# Patient Record
Sex: Male | Born: 1970
Health system: Southern US, Community
[De-identification: ages and names within clinical notes are randomized; demographics above are authoritative.]

## PROBLEM LIST (undated history)

## (undated) DIAGNOSIS — T7840XA Allergy, unspecified, initial encounter: Secondary | ICD-10-CM

## (undated) DIAGNOSIS — K760 Fatty (change of) liver, not elsewhere classified: Secondary | ICD-10-CM

## (undated) HISTORY — PX: CHOLECYSTECTOMY: SHX55

## (undated) HISTORY — DX: Allergy, unspecified, initial encounter: T78.40XA

## (undated) HISTORY — DX: Fatty (change of) liver, not elsewhere classified: K76.0

---

## 1999-04-01 ENCOUNTER — Encounter: Payer: Self-pay | Admitting: General Surgery

## 1999-04-03 ENCOUNTER — Observation Stay (HOSPITAL_COMMUNITY): Admission: RE | Admit: 1999-04-03 | Discharge: 1999-04-04 | Payer: Self-pay | Admitting: General Surgery

## 1999-04-03 ENCOUNTER — Encounter: Payer: Self-pay | Admitting: General Surgery

## 2013-07-19 ENCOUNTER — Ambulatory Visit: Payer: BC Managed Care – PPO

## 2013-07-19 ENCOUNTER — Other Ambulatory Visit: Payer: Self-pay | Admitting: Emergency Medicine

## 2013-07-19 ENCOUNTER — Ambulatory Visit (INDEPENDENT_AMBULATORY_CARE_PROVIDER_SITE_OTHER): Payer: BC Managed Care – PPO | Admitting: Emergency Medicine

## 2013-07-19 VITALS — BP 122/88 | HR 89 | Temp 97.3°F | Resp 16 | Ht 69.75 in | Wt 282.6 lb

## 2013-07-19 DIAGNOSIS — M109 Gout, unspecified: Secondary | ICD-10-CM

## 2013-07-19 DIAGNOSIS — M79641 Pain in right hand: Secondary | ICD-10-CM

## 2013-07-19 DIAGNOSIS — M79609 Pain in unspecified limb: Secondary | ICD-10-CM

## 2013-07-19 MED ORDER — COLCHICINE 0.6 MG PO TABS: ORAL_TABLET | ORAL | Status: AC

## 2013-07-19 MED ORDER — INDOMETHACIN 50 MG PO CAPS: 50.0000 mg | ORAL_CAPSULE | Freq: Two times a day (BID) | ORAL | Status: AC

## 2013-07-19 NOTE — Patient Instructions (Signed)

## 2013-07-19 NOTE — Progress Notes (Addendum)
[facility] [address], [place] [address] [phone]  Date:  07/19/2013   Name:  Alexander Taylor   DOB:  03/12/1971   MRN:  [id number]  PCP:  No primary provider on file.    Chief Complaint: Wrist Pain   History of Present Illness:  Alexander Rouxoel Steger is a 43 y.o. very pleasant male patient who presents with the following:  No history of injury.  Says sudden onset swelling, redness and pain and tenderness right wrist last night.  No overuse.  No history of gout or inflammatory joint disease.  No history of antecedent infection.  No improvement with over the counter medications or other home remedies. Denies other complaint or health concern today.   There are no active problems to display for this patient.   Past Medical History  Diagnosis Date  . Allergy   . Fatty liver     Past Surgical History  Procedure Laterality Date  . Cholecystectomy      History  Substance Use Topics  . Smoking status: Never Smoker   . Smokeless tobacco: Not on file  . Alcohol Use: Yes    Family History  Problem Relation Age of Onset  . Hyperlipidemia Mother   . Hypertension Mother   . Cancer Sister   . Hypertension Brother   . Hyperlipidemia Brother   . Stroke Brother   . Cancer Maternal Grandmother     Allergies  Allergen Reactions  . Penicillins Rash    Medication list has been reviewed and updated.  No current outpatient prescriptions on file prior to visit.   No current facility-administered medications on file prior to visit.    Review of Systems:  As per HPI, otherwise negative.    Physical Examination: Filed Vitals:   07/19/13 1633  BP: 122/88  Pulse: 89  Temp: 97.3 F (36.3 C)  Resp: 16   Filed Vitals:   07/19/13 1633  Height: 5' 9.75" (1.772 m)  Weight: 282 lb 9.6 oz (128.187 kg)   Body mass index is 40.82 kg/(m^2). Ideal Body Weight: Weight in (lb) to have BMI = 25: 172.6   GEN: WDWN, NAD, Non-toxic, Alert & Oriented x 3 HEENT:  Atraumatic, Normocephalic.  Ears and Nose: No external deformity. EXTR: No clubbing/cyanosis/edema NEURO: Normal gait.  PSYCH: Normally interactive. Conversant. Not depressed or anxious appearing.  Calm demeanor.  RIGHT wrist:  Tender, erythematous, mild effusion.  Guards flexion and extension.  NATI  Assessment and Plan: Gout  Signed,  Phillips OdorJeffery Anderson, MD   [facility] reading (PRIMARY) by  Dr. Dareen PianoAnderson.  negative.

## 2013-07-20 LAB — URIC ACID: Uric Acid, Serum: 7.2 mg/dL (ref 4.0–7.8)

## 2015-09-10 DIAGNOSIS — M62838 Other muscle spasm: Secondary | ICD-10-CM | POA: Diagnosis not present

## 2015-09-10 DIAGNOSIS — M9902 Segmental and somatic dysfunction of thoracic region: Secondary | ICD-10-CM | POA: Diagnosis not present

## 2015-09-10 DIAGNOSIS — M9901 Segmental and somatic dysfunction of cervical region: Secondary | ICD-10-CM | POA: Diagnosis not present

## 2015-09-10 DIAGNOSIS — M25512 Pain in left shoulder: Secondary | ICD-10-CM | POA: Diagnosis not present

## 2015-09-13 DIAGNOSIS — M9901 Segmental and somatic dysfunction of cervical region: Secondary | ICD-10-CM | POA: Diagnosis not present

## 2015-09-13 DIAGNOSIS — M62838 Other muscle spasm: Secondary | ICD-10-CM | POA: Diagnosis not present

## 2015-09-13 DIAGNOSIS — M9902 Segmental and somatic dysfunction of thoracic region: Secondary | ICD-10-CM | POA: Diagnosis not present

## 2015-09-13 DIAGNOSIS — M25512 Pain in left shoulder: Secondary | ICD-10-CM | POA: Diagnosis not present

## 2015-09-18 DIAGNOSIS — M9901 Segmental and somatic dysfunction of cervical region: Secondary | ICD-10-CM | POA: Diagnosis not present

## 2015-09-18 DIAGNOSIS — M25512 Pain in left shoulder: Secondary | ICD-10-CM | POA: Diagnosis not present

## 2015-09-18 DIAGNOSIS — M62838 Other muscle spasm: Secondary | ICD-10-CM | POA: Diagnosis not present

## 2015-09-18 DIAGNOSIS — M9902 Segmental and somatic dysfunction of thoracic region: Secondary | ICD-10-CM | POA: Diagnosis not present

## 2015-09-21 DIAGNOSIS — M9902 Segmental and somatic dysfunction of thoracic region: Secondary | ICD-10-CM | POA: Diagnosis not present

## 2015-09-21 DIAGNOSIS — M62838 Other muscle spasm: Secondary | ICD-10-CM | POA: Diagnosis not present

## 2015-09-21 DIAGNOSIS — M25512 Pain in left shoulder: Secondary | ICD-10-CM | POA: Diagnosis not present

## 2015-09-21 DIAGNOSIS — M9901 Segmental and somatic dysfunction of cervical region: Secondary | ICD-10-CM | POA: Diagnosis not present

## 2015-10-16 DIAGNOSIS — H5213 Myopia, bilateral: Secondary | ICD-10-CM | POA: Diagnosis not present

## 2015-10-17 DIAGNOSIS — J019 Acute sinusitis, unspecified: Secondary | ICD-10-CM | POA: Diagnosis not present

## 2016-01-31 DIAGNOSIS — K7581 Nonalcoholic steatohepatitis (NASH): Secondary | ICD-10-CM | POA: Diagnosis not present

## 2016-01-31 DIAGNOSIS — J4520 Mild intermittent asthma, uncomplicated: Secondary | ICD-10-CM | POA: Diagnosis not present

## 2016-01-31 DIAGNOSIS — E781 Pure hyperglyceridemia: Secondary | ICD-10-CM | POA: Diagnosis not present

## 2016-01-31 DIAGNOSIS — M109 Gout, unspecified: Secondary | ICD-10-CM | POA: Diagnosis not present

## 2016-01-31 DIAGNOSIS — Z0001 Encounter for general adult medical examination with abnormal findings: Secondary | ICD-10-CM | POA: Diagnosis not present

## 2016-03-03 DIAGNOSIS — R05 Cough: Secondary | ICD-10-CM | POA: Diagnosis not present

## 2016-03-03 DIAGNOSIS — B349 Viral infection, unspecified: Secondary | ICD-10-CM | POA: Diagnosis not present

## 2016-03-18 DIAGNOSIS — J209 Acute bronchitis, unspecified: Secondary | ICD-10-CM | POA: Diagnosis not present

## 2016-03-18 DIAGNOSIS — R062 Wheezing: Secondary | ICD-10-CM | POA: Diagnosis not present

## 2016-03-18 DIAGNOSIS — R05 Cough: Secondary | ICD-10-CM | POA: Diagnosis not present

## 2016-04-04 DIAGNOSIS — M9903 Segmental and somatic dysfunction of lumbar region: Secondary | ICD-10-CM | POA: Diagnosis not present

## 2016-04-04 DIAGNOSIS — M545 Low back pain: Secondary | ICD-10-CM | POA: Diagnosis not present

## 2016-04-04 DIAGNOSIS — M9904 Segmental and somatic dysfunction of sacral region: Secondary | ICD-10-CM | POA: Diagnosis not present

## 2016-04-04 DIAGNOSIS — M546 Pain in thoracic spine: Secondary | ICD-10-CM | POA: Diagnosis not present

## 2016-04-07 DIAGNOSIS — M9904 Segmental and somatic dysfunction of sacral region: Secondary | ICD-10-CM | POA: Diagnosis not present

## 2016-04-07 DIAGNOSIS — M545 Low back pain: Secondary | ICD-10-CM | POA: Diagnosis not present

## 2016-04-07 DIAGNOSIS — M9903 Segmental and somatic dysfunction of lumbar region: Secondary | ICD-10-CM | POA: Diagnosis not present

## 2016-04-07 DIAGNOSIS — M546 Pain in thoracic spine: Secondary | ICD-10-CM | POA: Diagnosis not present

## 2016-07-25 MED FILL — OSELTAMIVIR PHOSPHATE 75 MG: 75 | 10 days supply | Qty: 10 | Fill #0

## 2016-11-26 DIAGNOSIS — H524 Presbyopia: Secondary | ICD-10-CM | POA: Diagnosis not present

## 2017-02-05 DIAGNOSIS — Z1211 Encounter for screening for malignant neoplasm of colon: Secondary | ICD-10-CM | POA: Diagnosis not present

## 2017-02-05 DIAGNOSIS — K429 Umbilical hernia without obstruction or gangrene: Secondary | ICD-10-CM | POA: Diagnosis not present

## 2017-02-05 DIAGNOSIS — K760 Fatty (change of) liver, not elsewhere classified: Secondary | ICD-10-CM | POA: Diagnosis not present

## 2017-02-05 DIAGNOSIS — K7581 Nonalcoholic steatohepatitis (NASH): Secondary | ICD-10-CM | POA: Diagnosis not present

## 2017-03-04 DIAGNOSIS — Z0000 Encounter for general adult medical examination without abnormal findings: Secondary | ICD-10-CM | POA: Diagnosis not present

## 2017-03-04 DIAGNOSIS — Z13220 Encounter for screening for lipoid disorders: Secondary | ICD-10-CM | POA: Diagnosis not present

## 2017-03-04 DIAGNOSIS — Z23 Encounter for immunization: Secondary | ICD-10-CM | POA: Diagnosis not present

## 2017-04-14 DIAGNOSIS — K5730 Diverticulosis of large intestine without perforation or abscess without bleeding: Secondary | ICD-10-CM | POA: Diagnosis not present

## 2017-04-14 DIAGNOSIS — K640 First degree hemorrhoids: Secondary | ICD-10-CM | POA: Diagnosis not present

## 2017-04-14 DIAGNOSIS — Z1211 Encounter for screening for malignant neoplasm of colon: Secondary | ICD-10-CM | POA: Diagnosis not present

## 2017-05-15 DIAGNOSIS — M545 Low back pain: Secondary | ICD-10-CM | POA: Diagnosis not present

## 2017-05-15 DIAGNOSIS — M9903 Segmental and somatic dysfunction of lumbar region: Secondary | ICD-10-CM | POA: Diagnosis not present

## 2017-05-15 DIAGNOSIS — M5406 Panniculitis affecting regions of neck and back, lumbar region: Secondary | ICD-10-CM | POA: Diagnosis not present

## 2017-05-15 DIAGNOSIS — M9902 Segmental and somatic dysfunction of thoracic region: Secondary | ICD-10-CM | POA: Diagnosis not present

## 2017-11-26 DIAGNOSIS — M9901 Segmental and somatic dysfunction of cervical region: Secondary | ICD-10-CM | POA: Diagnosis not present

## 2017-11-26 DIAGNOSIS — M9902 Segmental and somatic dysfunction of thoracic region: Secondary | ICD-10-CM | POA: Diagnosis not present

## 2017-11-26 DIAGNOSIS — M546 Pain in thoracic spine: Secondary | ICD-10-CM | POA: Diagnosis not present

## 2017-11-26 DIAGNOSIS — M62838 Other muscle spasm: Secondary | ICD-10-CM | POA: Diagnosis not present

## 2017-12-01 DIAGNOSIS — J069 Acute upper respiratory infection, unspecified: Secondary | ICD-10-CM | POA: Diagnosis not present

## 2017-12-14 DIAGNOSIS — H11433 Conjunctival hyperemia, bilateral: Secondary | ICD-10-CM | POA: Diagnosis not present

## 2017-12-18 DIAGNOSIS — R05 Cough: Secondary | ICD-10-CM | POA: Diagnosis not present

## 2017-12-18 DIAGNOSIS — H5213 Myopia, bilateral: Secondary | ICD-10-CM | POA: Diagnosis not present

## 2017-12-18 DIAGNOSIS — J329 Chronic sinusitis, unspecified: Secondary | ICD-10-CM | POA: Diagnosis not present

## 2018-02-05 DIAGNOSIS — K760 Fatty (change of) liver, not elsewhere classified: Secondary | ICD-10-CM | POA: Diagnosis not present

## 2018-02-05 DIAGNOSIS — K7581 Nonalcoholic steatohepatitis (NASH): Secondary | ICD-10-CM | POA: Diagnosis not present

## 2018-04-12 DIAGNOSIS — Z0000 Encounter for general adult medical examination without abnormal findings: Secondary | ICD-10-CM | POA: Diagnosis not present

## 2018-04-15 DIAGNOSIS — J45998 Other asthma: Secondary | ICD-10-CM | POA: Diagnosis not present

## 2018-04-15 DIAGNOSIS — R7309 Other abnormal glucose: Secondary | ICD-10-CM | POA: Diagnosis not present

## 2018-04-15 DIAGNOSIS — M109 Gout, unspecified: Secondary | ICD-10-CM | POA: Diagnosis not present

## 2018-04-15 DIAGNOSIS — Z0000 Encounter for general adult medical examination without abnormal findings: Secondary | ICD-10-CM | POA: Diagnosis not present

## 2018-08-03 DIAGNOSIS — M542 Cervicalgia: Secondary | ICD-10-CM | POA: Diagnosis not present

## 2018-08-03 DIAGNOSIS — M9901 Segmental and somatic dysfunction of cervical region: Secondary | ICD-10-CM | POA: Diagnosis not present

## 2018-08-03 DIAGNOSIS — M546 Pain in thoracic spine: Secondary | ICD-10-CM | POA: Diagnosis not present

## 2018-08-03 DIAGNOSIS — M9902 Segmental and somatic dysfunction of thoracic region: Secondary | ICD-10-CM | POA: Diagnosis not present

## 2018-08-05 DIAGNOSIS — M542 Cervicalgia: Secondary | ICD-10-CM | POA: Diagnosis not present

## 2018-08-05 DIAGNOSIS — M9901 Segmental and somatic dysfunction of cervical region: Secondary | ICD-10-CM | POA: Diagnosis not present

## 2018-08-05 DIAGNOSIS — M546 Pain in thoracic spine: Secondary | ICD-10-CM | POA: Diagnosis not present

## 2018-08-05 DIAGNOSIS — M9902 Segmental and somatic dysfunction of thoracic region: Secondary | ICD-10-CM | POA: Diagnosis not present

## 2018-08-09 DIAGNOSIS — M546 Pain in thoracic spine: Secondary | ICD-10-CM | POA: Diagnosis not present

## 2018-08-09 DIAGNOSIS — M9901 Segmental and somatic dysfunction of cervical region: Secondary | ICD-10-CM | POA: Diagnosis not present

## 2018-08-09 DIAGNOSIS — M9902 Segmental and somatic dysfunction of thoracic region: Secondary | ICD-10-CM | POA: Diagnosis not present

## 2018-08-09 DIAGNOSIS — M542 Cervicalgia: Secondary | ICD-10-CM | POA: Diagnosis not present

## 2019-01-24 DIAGNOSIS — S1086XA Insect bite of other specified part of neck, initial encounter: Secondary | ICD-10-CM | POA: Diagnosis not present

## 2019-01-24 DIAGNOSIS — L259 Unspecified contact dermatitis, unspecified cause: Secondary | ICD-10-CM | POA: Diagnosis not present

## 2019-02-08 DIAGNOSIS — H5213 Myopia, bilateral: Secondary | ICD-10-CM | POA: Diagnosis not present

## 2019-05-19 DIAGNOSIS — Z13220 Encounter for screening for lipoid disorders: Secondary | ICD-10-CM | POA: Diagnosis not present

## 2019-05-19 DIAGNOSIS — K7581 Nonalcoholic steatohepatitis (NASH): Secondary | ICD-10-CM | POA: Diagnosis not present

## 2019-05-19 DIAGNOSIS — R7309 Other abnormal glucose: Secondary | ICD-10-CM | POA: Diagnosis not present

## 2019-05-19 DIAGNOSIS — R0789 Other chest pain: Secondary | ICD-10-CM | POA: Diagnosis not present

## 2019-05-19 DIAGNOSIS — Z0000 Encounter for general adult medical examination without abnormal findings: Secondary | ICD-10-CM | POA: Diagnosis not present

## 2019-05-19 DIAGNOSIS — M109 Gout, unspecified: Secondary | ICD-10-CM | POA: Diagnosis not present

## 2019-05-19 DIAGNOSIS — J45998 Other asthma: Secondary | ICD-10-CM | POA: Diagnosis not present

## 2019-05-24 ENCOUNTER — Telehealth: Payer: Self-pay | Admitting: *Deleted

## 2019-05-24 NOTE — Telephone Encounter (Signed)
A message was left, re: his new patient appointment. 

## 2019-06-21 ENCOUNTER — Other Ambulatory Visit: Payer: Self-pay

## 2019-06-21 ENCOUNTER — Encounter: Payer: Self-pay | Admitting: Cardiovascular Disease

## 2019-06-21 ENCOUNTER — Ambulatory Visit: Payer: BLUE CROSS/BLUE SHIELD | Admitting: Cardiovascular Disease

## 2019-06-21 DIAGNOSIS — R0609 Other forms of dyspnea: Secondary | ICD-10-CM

## 2019-06-21 DIAGNOSIS — R072 Precordial pain: Secondary | ICD-10-CM

## 2019-06-21 DIAGNOSIS — R0789 Other chest pain: Secondary | ICD-10-CM | POA: Diagnosis not present

## 2019-06-21 DIAGNOSIS — E785 Hyperlipidemia, unspecified: Secondary | ICD-10-CM | POA: Insufficient documentation

## 2019-06-21 DIAGNOSIS — E782 Mixed hyperlipidemia: Secondary | ICD-10-CM | POA: Diagnosis not present

## 2019-06-21 DIAGNOSIS — R0600 Dyspnea, unspecified: Secondary | ICD-10-CM | POA: Diagnosis not present

## 2019-06-21 NOTE — Assessment & Plan Note (Signed)
Mild hyperlipidemia with an LDL of 115 measured 07/19/2018.  He does admit to dietary indiscretion.  We will provide him with a heart healthy diet.

## 2019-06-21 NOTE — Assessment & Plan Note (Signed)
Dyspnea on exertion of the last several months.  He has gained 15 to 20 pounds since the onset of Covid.  His exam is benign.  I am going to get a 2D echo to further evaluate

## 2019-06-21 NOTE — Progress Notes (Signed)
06/21/2019 Alexander Taylor   1970-06-25  [id number]  Primary Physician Alexander Floro, MD Primary Cardiologist: Alexander Gess MD Alexander Taylor, [place]  HPI:  Alexander Taylor is a 49 y.o. morbidly overweight married Caucasian male father of two 86-year-old twins who works as a Psychiatric nurse at [employer].  He was referred by Dr. Tenny Taylor for atypical chest pain.  He has no cardiac risk factors.  He does not smoke.  He there is no family history.  Is never had a heart attack or stroke.  Is really on no medications other than gout meds.  He is complained of a dull chest pressure with onset over several months ago occurring about once a week lasting minutes to hours at a time is not brought on by activity and resolve spontaneously.  There has been occasional left upper extremity radiation.  Also complains of some dyspnea on exertion.  Is gained 1520 pound since the onset of COVID-19.   Current Meds  Medication Sig  . allopurinol (ZYLOPRIM) 100 MG tablet Take 100 mg by mouth daily as needed.  . colchicine 0.6 MG tablet Two now and one in one hour.  Starting tomorrow one daily  . fluticasone (FLONASE) 50 MCG/ACT nasal spray Place 1 spray into both nostrils as needed for allergies or rhinitis.  . indomethacin (INDOCIN) 50 MG capsule Take 1 capsule (50 mg total) by mouth 2 (two) times daily with a meal. (Patient taking differently: Take 50 mg by mouth as needed. )  . montelukast (SINGULAIR) 10 MG tablet Take 10 mg by mouth daily.  . Multiple Vitamin (MULTIVITAMIN ADULT PO) Take 1 tablet by mouth daily.     Allergies  Allergen Reactions  . Penicillins Rash    Social History   Socioeconomic History  . Marital status: Married    Spouse name: Not on file  . Number of children: Not on file  . Years of education: Not on file  . Highest education level: Not on file  Occupational History  . Not on file  Tobacco Use  . Smoking status: Never Smoker  . Smokeless tobacco: Never Used    Substance and Sexual Activity  . Alcohol use: Yes  . Drug use: No  . Sexual activity: Not on file  Other Topics Concern  . Not on file  Social History Narrative  . Not on file   Social Determinants of Health   Financial Resource Strain:   . Difficulty of Paying Living Expenses: Not on file  Food Insecurity:   . Worried About Programme researcher, broadcasting/film/video in the Last Year: Not on file  . Ran Out of Food in the Last Year: Not on file  Transportation Needs:   . Lack of Transportation (Medical): Not on file  . Lack of Transportation (Non-Medical): Not on file  Physical Activity:   . Days of Exercise per Week: Not on file  . Minutes of Exercise per Session: Not on file  Stress:   . Feeling of Stress : Not on file  Social Connections:   . Frequency of Communication with Friends and Family: Not on file  . Frequency of Social Gatherings with Friends and Family: Not on file  . Attends Religious Services: Not on file  . Active Member of Clubs or Organizations: Not on file  . Attends Banker Meetings: Not on file  . Marital Status: Not on file  Intimate Partner Violence:   . Fear of Current or Ex-Partner:  Not on file  . Emotionally Abused: Not on file  . Physically Abused: Not on file  . Sexually Abused: Not on file     Review of Systems: General: negative for chills, fever, night sweats or weight changes.  Cardiovascular: negative for chest pain, dyspnea on exertion, edema, orthopnea, palpitations, paroxysmal nocturnal dyspnea or shortness of breath Dermatological: negative for rash Respiratory: negative for cough or wheezing Urologic: negative for hematuria Abdominal: negative for nausea, vomiting, diarrhea, bright red blood per rectum, melena, or hematemesis Neurologic: negative for visual changes, syncope, or dizziness All other systems reviewed and are otherwise negative except as noted above.    Blood pressure 112/80, pulse 95, temperature (!) 97.2 F (36.2 C),  height 6' (1.829 m), weight (!) 324 lb (147 kg).  General appearance: alert and no distress Neck: no adenopathy, no carotid bruit, no JVD, supple, symmetrical, trachea midline and thyroid not enlarged, symmetric, no tenderness/mass/nodules Lungs: clear to auscultation bilaterally Heart: regular rate and rhythm, S1, S2 normal, no murmur, click, rub or gallop Extremities: extremities normal, atraumatic, no cyanosis or edema Pulses: 2+ and symmetric Skin: Skin color, texture, turgor normal. No rashes or lesions Neurologic: Alert and oriented X 3, normal strength and tone. Normal symmetric reflexes. Normal coordination and gait  EKG sinus rhythm at 95 with nonspecific ST and T wave changes.  I personally reviewed this EKG.  ASSESSMENT AND PLAN:   Morbid obesity ([place]) BMI of 44.  I am going to provide him a heart healthy diet.  He really does not exercise.  Atypical chest pain History of atypical chest pain for last several months characterized as a pressure sensation lasting for minutes or hours at a time with occasional radiation to left upper extremity.  It occurs approxi once a week.  He really has no risk factors.  Go to get a coronary calcium score to restratify  Hyperlipidemia Mild hyperlipidemia with an LDL of 115 measured 07/19/2018.  He does admit to dietary indiscretion.  We will provide him with a heart healthy diet.  Dyspnea on exertion Dyspnea on exertion of the last several months.  He has gained 15 to 20 pounds since the onset of Covid.  His exam is benign.  I am going to get a 2D echo to further evaluate      Alexander Harp MD [facility], [facility] 06/21/2019 9:46 AM

## 2019-06-21 NOTE — Assessment & Plan Note (Addendum)
BMI of 44.  I am going to provide him a heart healthy diet.  He really does not exercise.

## 2019-06-21 NOTE — Patient Instructions (Addendum)
Medication Instructions:  Your physician recommends that you continue on your current medications as directed. Please refer to the Current Medication list given to you today.  If you need a refill on your cardiac medications before your next appointment, please call your pharmacy.   Lab work: NONE  Testing/Procedures: Your physician has requested that you have an echocardiogram. Echocardiography is a painless test that uses sound waves to create images of your heart. It provides your doctor with information about the size and shape of your heart and how well your heart's chambers and valves are working. This procedure takes approximately one hour. There are no restrictions for this procedure. [address]  AND   Coronary Calcium Score  Follow-Up: [facility], you and your health needs are our priority.  As part of our continuing mission to provide you with exceptional heart care, we have created designated Provider Care Teams.  These Care Teams include your primary Cardiologist (physician) and Advanced Practice Providers (APPs -  Physician Assistants and Nurse Practitioners) who all work together to provide you with the care you need, when you need it. You may see Dr Allyson SabalBerry or one of the following Advanced Practice Providers on your designated Care Team:    Corine ShelterLuke Kilroy, PA-C  Laurel Mountainallie Goodrich, [place]-C  Edd FabianJesse Cleaver, [place]  Your physician wants you to follow-up in: 2 months with Dr. Allyson SabalBerry  Any Other Special Instructions Will Be Listed Below (If Applicable).   Coronary Calcium Scan A coronary calcium scan is an imaging test used to look for deposits of plaque in the inner lining of the blood vessels of the heart (coronary arteries). Plaque is made up of calcium, protein, and fatty substances. These deposits of plaque can partly clog and narrow the coronary arteries without producing any symptoms or warning signs. This puts a person at risk for a heart attack. This  test is recommended for people who are at moderate risk for heart disease. The test can find plaque deposits before symptoms develop. Tell a health care provider about:  Any allergies you have.  All medicines you are taking, including vitamins, herbs, eye drops, creams, and over-the-counter medicines.  Any problems you or family members have had with anesthetic medicines.  Any blood disorders you have.  Any surgeries you have had.  Any medical conditions you have.  Whether you are pregnant or may be pregnant. What are the risks? Generally, this is a safe procedure. However, problems may occur, including:  Harm to a pregnant woman and her unborn baby. This test involves the use of radiation. Radiation exposure can be dangerous to a pregnant woman and her unborn baby. If you are pregnant or think you may be pregnant, you should not have this procedure done.  Slight increase in the risk of cancer. This is because of the radiation involved in the test. What happens before the procedure? Ask your health care provider for any specific instructions on how to prepare for this procedure. You may be asked to avoid products that contain caffeine, tobacco, or nicotine for 4 hours before the procedure. What happens during the procedure?   You will undress and remove any jewelry from your neck or chest.  You will put on a hospital gown.  Sticky electrodes will be placed on your chest. The electrodes will be connected to an electrocardiogram (ECG) machine to record a tracing of the electrical activity of your heart.  You will lie down on a curved bed that  is attached to the CT scanner.  You may be given medicine to slow down your heart rate so that clear pictures can be created.  You will be moved into the CT scanner, and the CT scanner will take pictures of your heart. During this time, you will be asked to lie still and hold your breath for 2-3 seconds at a time while each picture of your  heart is being taken. The procedure may vary among health care providers and hospitals. What happens after the procedure?  You can get dressed.  You can return to your normal activities.  It is up to you to get the results of your procedure. Ask your health care provider, or the department that is doing the procedure, when your results will be ready. Summary  A coronary calcium scan is an imaging test used to look for deposits of plaque in the inner lining of the blood vessels of the heart (coronary arteries). Plaque is made up of calcium, protein, and fatty substances.  Generally, this is a safe procedure. Tell your health care provider if you are pregnant or may be pregnant.  Ask your health care provider for any specific instructions on how to prepare for this procedure.  A CT scanner will take pictures of your heart.  You can return to your normal activities after the scan is done. This information is not intended to replace advice given to you by your health care provider. Make sure you discuss any questions you have with your health care provider. Document Revised: 12/07/2018 Document Reviewed: 12/07/2018 Elsevier Patient Education  2020 Elsevier Inc.    Heart-Healthy Eating Plan Many factors influence your heart (coronary) health, including eating and exercise habits. Coronary risk increases with abnormal blood fat (lipid) levels. Heart-healthy meal planning includes limiting unhealthy fats, increasing healthy fats, and making other diet and lifestyle changes. Cooking Cook foods using methods other than frying. Baking, boiling, grilling, and broiling are all good options. Other ways to reduce fat include:  Removing the skin from poultry.  Removing all visible fats from meats.  Steaming vegetables in water or broth. Meal planning   At meals, imagine dividing your plate into fourths: ? Fill one-half of your plate with vegetables and green salads. ? Fill one-fourth of  your plate with whole grains. ? Fill one-fourth of your plate with lean protein foods.  Eat 4-5 servings of vegetables per day. One serving equals 1 cup raw or cooked vegetable, or 2 cups raw leafy greens.  Eat 4-5 servings of fruit per day. One serving equals 1 medium whole fruit,  cup dried fruit,  cup fresh, frozen, or canned fruit, or  cup 100% fruit juice.  Eat more foods that contain soluble fiber. Examples include apples, broccoli, carrots, beans, peas, and barley. Aim to get 25-30 g of fiber per day.  Increase your consumption of legumes, nuts, and seeds to 4-5 servings per week. One serving of dried beans or legumes equals  cup cooked, 1 serving of nuts is  cup, and 1 serving of seeds equals 1 tablespoon. Fats  Choose healthy fats more often. Choose monounsaturated and polyunsaturated fats, such as olive and canola oils, flaxseeds, walnuts, almonds, and seeds.  Eat more omega-3 fats. Choose salmon, mackerel, sardines, tuna, flaxseed oil, and ground flaxseeds. Aim to eat fish at least 2 times each week.  Check food labels carefully to identify foods with trans fats or high amounts of saturated fat.  Limit saturated fats. These are found in  animal products, such as meats, butter, and cream. Plant sources of saturated fats include palm oil, palm kernel oil, and coconut oil.  Avoid foods with partially hydrogenated oils in them. These contain trans fats. Examples are stick margarine, some tub margarines, cookies, crackers, and other baked goods.  Avoid fried foods. General information  Eat more home-cooked food and less restaurant, buffet, and fast food.  Limit or avoid alcohol.  Limit foods that are high in starch and sugar.  Lose weight if you are overweight. Losing just 5-10% of your body weight can help your overall health and prevent diseases such as diabetes and heart disease.  Monitor your salt (sodium) intake, especially if you have high blood pressure. Talk with  your health care provider about your sodium intake.  Try to incorporate more vegetarian meals weekly. What foods can I eat? Fruits All fresh, canned (in natural juice), or frozen fruits. Vegetables Fresh or frozen vegetables (raw, steamed, roasted, or grilled). Green salads. Grains Most grains. Choose whole wheat and whole grains most of the time. Rice and pasta, including brown rice and pastas made with whole wheat. Meats and other proteins Lean, well-trimmed beef, veal, pork, and lamb. Chicken and [place] without skin. All fish and shellfish. Wild duck, rabbit, pheasant, and venison. Egg whites or low-cholesterol egg substitutes. Dried beans, peas, lentils, and tofu. Seeds and most nuts. Dairy Low-fat or nonfat cheeses, including ricotta and mozzarella. Skim or 1% milk (liquid, powdered, or evaporated). Buttermilk made with low-fat milk. Nonfat or low-fat yogurt. Fats and oils Non-hydrogenated (trans-free) margarines. Vegetable oils, including soybean, sesame, sunflower, olive, peanut, safflower, corn, canola, and cottonseed. Salad dressings or mayonnaise made with a vegetable oil. Beverages Water (mineral or sparkling). Coffee and tea. Diet carbonated beverages. Sweets and desserts Sherbet, gelatin, and fruit ice. Small amounts of dark chocolate. Limit all sweets and desserts. Seasonings and condiments All seasonings and condiments. The items listed above may not be a complete list of foods and beverages you can eat. Contact a dietitian for more options. What foods are not recommended? Fruits Canned fruit in heavy syrup. Fruit in cream or butter sauce. Fried fruit. Limit coconut. Vegetables Vegetables cooked in cheese, cream, or butter sauce. Fried vegetables. Grains Breads made with saturated or trans fats, oils, or whole milk. Croissants. Sweet rolls. Donuts. High-fat crackers, such as cheese crackers. Meats and other proteins Fatty meats, such as hot dogs, ribs, sausage, bacon,  rib-eye roast or steak. High-fat deli meats, such as salami and bologna. Caviar. Domestic duck and goose. Organ meats, such as liver. Dairy Cream, sour cream, cream cheese, and creamed cottage cheese. Whole milk cheeses. Whole or 2% milk (liquid, evaporated, or condensed). Whole buttermilk. Cream sauce or high-fat cheese sauce. Whole-milk yogurt. Fats and oils Meat fat, or shortening. Cocoa butter, hydrogenated oils, palm oil, coconut oil, palm kernel oil. Solid fats and shortenings, including bacon fat, salt pork, lard, and butter. Nondairy cream substitutes. Salad dressings with cheese or sour cream. Beverages Regular sodas and any drinks with added sugar. Sweets and desserts Frosting. Pudding. Cookies. Cakes. Pies. Milk chocolate or white chocolate. Buttered syrups. Full-fat ice cream or ice cream drinks. The items listed above may not be a complete list of foods and beverages to avoid. Contact a dietitian for more information. Summary  Heart-healthy meal planning includes limiting unhealthy fats, increasing healthy fats, and making other diet and lifestyle changes.  Lose weight if you are overweight. Losing just 5-10% of your body weight can help your overall health  and prevent diseases such as diabetes and heart disease.  Focus on eating a balance of foods, including fruits and vegetables, low-fat or nonfat dairy, lean protein, nuts and legumes, whole grains, and heart-healthy oils and fats. This information is not intended to replace advice given to you by your health care provider. Make sure you discuss any questions you have with your health care provider. Document Revised: 06/26/2017 Document Reviewed: 06/26/2017 Elsevier Patient Education  2020 Elsevier Inc.    DASH Eating Plan DASH stands for "Dietary Approaches to Stop Hypertension." The DASH eating plan is a healthy eating plan that has been shown to reduce high blood pressure (hypertension). It may also reduce your risk for  type 2 diabetes, heart disease, and stroke. The DASH eating plan may also help with weight loss. What are tips for following this plan?  General guidelines  Avoid eating more than 2,300 mg (milligrams) of salt (sodium) a day. If you have hypertension, you may need to reduce your sodium intake to 1,500 mg a day.  Limit alcohol intake to no more than 1 drink a day for nonpregnant women and 2 drinks a day for men. One drink equals 12 oz of beer, 5 oz of wine, or 1 oz of hard liquor.  Work with your health care provider to maintain a healthy body weight or to lose weight. Ask what an ideal weight is for you.  Get at least 30 minutes of exercise that causes your heart to beat faster (aerobic exercise) most days of the week. Activities may include walking, swimming, or biking.  Work with your health care provider or diet and nutrition specialist (dietitian) to adjust your eating plan to your individual calorie needs. Reading food labels   Check food labels for the amount of sodium per serving. Choose foods with less than 5 percent of the Daily Value of sodium. Generally, foods with less than 300 mg of sodium per serving fit into this eating plan.  To find whole grains, look for the word "whole" as the first word in the ingredient list. Shopping  Buy products labeled as "low-sodium" or "no salt added."  Buy fresh foods. Avoid canned foods and premade or frozen meals. Cooking  Avoid adding salt when cooking. Use salt-free seasonings or herbs instead of table salt or sea salt. Check with your health care provider or pharmacist before using salt substitutes.  Do not fry foods. Cook foods using healthy methods such as baking, boiling, grilling, and broiling instead.  Cook with heart-healthy oils, such as olive, canola, soybean, or sunflower oil. Meal planning  Eat a balanced diet that includes: ? 5 or more servings of fruits and vegetables each day. At each meal, try to fill half of your  plate with fruits and vegetables. ? Up to 6-8 servings of whole grains each day. ? Less than 6 oz of lean meat, poultry, or fish each day. A 3-oz serving of meat is about the same size as a deck of cards. One egg equals 1 oz. ? 2 servings of low-fat dairy each day. ? A serving of nuts, seeds, or beans 5 times each week. ? Heart-healthy fats. Healthy fats called Omega-3 fatty acids are found in foods such as flaxseeds and coldwater fish, like sardines, salmon, and mackerel.  Limit how much you eat of the following: ? Canned or prepackaged foods. ? Food that is high in trans fat, such as fried foods. ? Food that is high in saturated fat, such as fatty meat. ?  Sweets, desserts, sugary drinks, and other foods with added sugar. ? Full-fat dairy products.  Do not salt foods before eating.  Try to eat at least 2 vegetarian meals each week.  Eat more home-cooked food and less restaurant, buffet, and fast food.  When eating at a restaurant, ask that your food be prepared with less salt or no salt, if possible. What foods are recommended? The items listed may not be a complete list. Talk with your dietitian about what dietary choices are best for you. Grains Whole-grain or whole-wheat bread. Whole-grain or whole-wheat pasta. Brown rice. Orpah Cobb. Bulgur. Whole-grain and low-sodium cereals. Pita bread. Low-fat, low-sodium crackers. Whole-wheat flour tortillas. Vegetables Fresh or frozen vegetables (raw, steamed, roasted, or grilled). Low-sodium or reduced-sodium tomato and vegetable juice. Low-sodium or reduced-sodium tomato sauce and tomato paste. Low-sodium or reduced-sodium canned vegetables. Fruits All fresh, dried, or frozen fruit. Canned fruit in natural juice (without added sugar). Meat and other protein foods Skinless chicken or [place]. Ground chicken or [place]. Pork with fat trimmed off. Fish and seafood. Egg whites. Dried beans, peas, or lentils. Unsalted nuts, nut butters, and  seeds. Unsalted canned beans. Lean cuts of beef with fat trimmed off. Low-sodium, lean deli meat. Dairy Low-fat (1%) or fat-free (skim) milk. Fat-free, low-fat, or reduced-fat cheeses. Nonfat, low-sodium ricotta or cottage cheese. Low-fat or nonfat yogurt. Low-fat, low-sodium cheese. Fats and oils Soft margarine without trans fats. Vegetable oil. Low-fat, reduced-fat, or light mayonnaise and salad dressings (reduced-sodium). Canola, safflower, olive, soybean, and sunflower oils. Avocado. Seasoning and other foods Herbs. Spices. Seasoning mixes without salt. Unsalted popcorn and pretzels. Fat-free sweets. What foods are not recommended? The items listed may not be a complete list. Talk with your dietitian about what dietary choices are best for you. Grains Baked goods made with fat, such as croissants, muffins, or some breads. Dry pasta or rice meal packs. Vegetables Creamed or fried vegetables. Vegetables in a cheese sauce. Regular canned vegetables (not low-sodium or reduced-sodium). Regular canned tomato sauce and paste (not low-sodium or reduced-sodium). Regular tomato and vegetable juice (not low-sodium or reduced-sodium). Rosita Fire. Olives. Fruits Canned fruit in a light or heavy syrup. Fried fruit. Fruit in cream or butter sauce. Meat and other protein foods Fatty cuts of meat. Ribs. Fried meat. Tomasa Blase. Sausage. Bologna and other processed lunch meats. Salami. Fatback. Hotdogs. Bratwurst. Salted nuts and seeds. Canned beans with added salt. Canned or smoked fish. Whole eggs or egg yolks. Chicken or [place] with skin. Dairy Whole or 2% milk, cream, and half-and-half. Whole or full-fat cream cheese. Whole-fat or sweetened yogurt. Full-fat cheese. Nondairy creamers. Whipped toppings. Processed cheese and cheese spreads. Fats and oils Butter. Stick margarine. Lard. Shortening. Ghee. Bacon fat. Tropical oils, such as coconut, palm kernel, or palm oil. Seasoning and other foods Salted popcorn and  pretzels. Onion salt, garlic salt, seasoned salt, table salt, and sea salt. Worcestershire sauce. Tartar sauce. Barbecue sauce. Teriyaki sauce. Soy sauce, including reduced-sodium. Steak sauce. Canned and packaged gravies. Fish sauce. Oyster sauce. Cocktail sauce. Horseradish that you find on the shelf. Ketchup. Mustard. Meat flavorings and tenderizers. Bouillon cubes. Hot sauce and Tabasco sauce. Premade or packaged marinades. Premade or packaged taco seasonings. Relishes. Regular salad dressings. Where to find more information:  National Heart, Lung, and Blood Institute: PopSteam.is  American Heart Association: www.heart.org Summary  The DASH eating plan is a healthy eating plan that has been shown to reduce high blood pressure (hypertension). It may also reduce your risk for type 2 diabetes,  heart disease, and stroke.  With the DASH eating plan, you should limit salt (sodium) intake to 2,300 mg a day. If you have hypertension, you may need to reduce your sodium intake to 1,500 mg a day.  When on the DASH eating plan, aim to eat more fresh fruits and vegetables, whole grains, lean proteins, low-fat dairy, and heart-healthy fats.  Work with your health care provider or diet and nutrition specialist (dietitian) to adjust your eating plan to your individual calorie needs. This information is not intended to replace advice given to you by your health care provider. Make sure you discuss any questions you have with your health care provider. Document Revised: 05/01/2017 Document Reviewed: 05/12/2016 Elsevier Patient Education  2020 [employer].

## 2019-06-21 NOTE — Assessment & Plan Note (Signed)
History of atypical chest pain for last several months characterized as a pressure sensation lasting for minutes or hours at a time with occasional radiation to left upper extremity.  It occurs approxi once a week.  He really has no risk factors.  Go to get a coronary calcium score to restratify

## 2019-06-24 DIAGNOSIS — Z20822 Contact with and (suspected) exposure to covid-19: Secondary | ICD-10-CM | POA: Diagnosis not present

## 2019-07-05 ENCOUNTER — Ambulatory Visit
Admission: RE | Admit: 2019-07-05 | Discharge: 2019-07-05 | Disposition: A | Discharge status: Home / Self Care | Payer: Self-pay | Source: Ambulatory Visit | Attending: Cardiovascular Disease | Admitting: Cardiovascular Disease

## 2019-07-05 ENCOUNTER — Other Ambulatory Visit: Payer: Self-pay

## 2019-07-05 ENCOUNTER — Ambulatory Visit (HOSPITAL_COMMUNITY): Payer: BLUE CROSS/BLUE SHIELD | Attending: Internal Medicine

## 2019-07-05 DIAGNOSIS — R0789 Other chest pain: Secondary | ICD-10-CM

## 2019-07-05 DIAGNOSIS — R072 Precordial pain: Secondary | ICD-10-CM

## 2019-07-13 DIAGNOSIS — M546 Pain in thoracic spine: Secondary | ICD-10-CM | POA: Diagnosis not present

## 2019-07-13 DIAGNOSIS — M9902 Segmental and somatic dysfunction of thoracic region: Secondary | ICD-10-CM | POA: Diagnosis not present

## 2019-07-13 DIAGNOSIS — M9901 Segmental and somatic dysfunction of cervical region: Secondary | ICD-10-CM | POA: Diagnosis not present

## 2019-07-13 DIAGNOSIS — M542 Cervicalgia: Secondary | ICD-10-CM | POA: Diagnosis not present

## 2019-08-16 ENCOUNTER — Encounter: Payer: Self-pay | Admitting: Cardiovascular Disease

## 2019-08-16 ENCOUNTER — Other Ambulatory Visit: Payer: Self-pay

## 2019-08-16 ENCOUNTER — Ambulatory Visit: Payer: BLUE CROSS/BLUE SHIELD | Admitting: Cardiovascular Disease

## 2019-08-16 DIAGNOSIS — R0600 Dyspnea, unspecified: Secondary | ICD-10-CM

## 2019-08-16 DIAGNOSIS — R0609 Other forms of dyspnea: Secondary | ICD-10-CM

## 2019-08-16 DIAGNOSIS — R0789 Other chest pain: Secondary | ICD-10-CM

## 2019-08-16 NOTE — Patient Instructions (Signed)
Medication Instructions:  NO CHANGE *If you need a refill on your cardiac medications before your next appointment, please call your pharmacy*   Lab Work: If you have labs (blood work) drawn today and your tests are completely normal, you will receive your results only by: Marland Kitchen MyChart Message (if you have MyChart) OR . A paper copy in the mail If you have any lab test that is abnormal or we need to change your treatment, we will call you to review the results.  Follow-Up: At [facility], you and your health needs are our priority.  As part of our continuing mission to provide you with exceptional heart care, we have created designated Provider Care Teams.  These Care Teams include your primary Cardiologist (physician) and Advanced Practice Providers (APPs -  Physician Assistants and Nurse Practitioners) who all work together to provide you with the care you need, when you need it.  We recommend signing up for the patient portal called "MyChart".  Sign up information is provided on this After Visit Summary.  MyChart is used to connect with patients for Virtual Visits (Telemedicine).  Patients are able to view lab/test results, encounter notes, upcoming appointments, etc.  Non-urgent messages can be sent to your provider as well.   To learn more about what you can do with MyChart, go to [email or web address].    Your next appointment:   AS NEEDED   Heart-Healthy Eating Plan Many factors influence your heart (coronary) health, including eating and exercise habits. Coronary risk increases with abnormal blood fat (lipid) levels. Heart-healthy meal planning includes limiting unhealthy fats, increasing healthy fats, and making other diet and lifestyle changes. What is my plan? Your health care provider may recommend that you:  Limit your fat intake to _________% or less of your total calories each day.  Limit your saturated fat intake to _________% or less of your total calories each  day.  Limit the amount of cholesterol in your diet to less than _________ mg per day. What are tips for following this plan? Cooking Cook foods using methods other than frying. Baking, boiling, grilling, and broiling are all good options. Other ways to reduce fat include:  Removing the skin from poultry.  Removing all visible fats from meats.  Steaming vegetables in water or broth. Meal planning   At meals, imagine dividing your plate into fourths: ? Fill one-half of your plate with vegetables and green salads. ? Fill one-fourth of your plate with whole grains. ? Fill one-fourth of your plate with lean protein foods.  Eat 4-5 servings of vegetables per day. One serving equals 1 cup raw or cooked vegetable, or 2 cups raw leafy greens.  Eat 4-5 servings of fruit per day. One serving equals 1 medium whole fruit,  cup dried fruit,  cup fresh, frozen, or canned fruit, or  cup 100% fruit juice.  Eat more foods that contain soluble fiber. Examples include apples, broccoli, carrots, beans, peas, and barley. Aim to get 25-30 g of fiber per day.  Increase your consumption of legumes, nuts, and seeds to 4-5 servings per week. One serving of dried beans or legumes equals  cup cooked, 1 serving of nuts is  cup, and 1 serving of seeds equals 1 tablespoon. Fats  Choose healthy fats more often. Choose monounsaturated and polyunsaturated fats, such as olive and canola oils, flaxseeds, walnuts, almonds, and seeds.  Eat more omega-3 fats. Choose salmon, mackerel, sardines, tuna, flaxseed oil, and ground flaxseeds. Aim to eat fish at least  2 times each week.  Check food labels carefully to identify foods with trans fats or high amounts of saturated fat.  Limit saturated fats. These are found in animal products, such as meats, butter, and cream. Plant sources of saturated fats include palm oil, palm kernel oil, and coconut oil.  Avoid foods with partially hydrogenated oils in them. These contain  trans fats. Examples are stick margarine, some tub margarines, cookies, crackers, and other baked goods.  Avoid fried foods. General information  Eat more home-cooked food and less restaurant, buffet, and fast food.  Limit or avoid alcohol.  Limit foods that are high in starch and sugar.  Lose weight if you are overweight. Losing just 5-10% of your body weight can help your overall health and prevent diseases such as diabetes and heart disease.  Monitor your salt (sodium) intake, especially if you have high blood pressure. Talk with your health care provider about your sodium intake.  Try to incorporate more vegetarian meals weekly. What foods can I eat? Fruits All fresh, canned (in natural juice), or frozen fruits. Vegetables Fresh or frozen vegetables (raw, steamed, roasted, or grilled). Green salads. Grains Most grains. Choose whole wheat and whole grains most of the time. Rice and pasta, including brown rice and pastas made with whole wheat. Meats and other proteins Lean, well-trimmed beef, veal, pork, and lamb. Chicken and [place] without skin. All fish and shellfish. Wild duck, rabbit, pheasant, and venison. Egg whites or low-cholesterol egg substitutes. Dried beans, peas, lentils, and tofu. Seeds and most nuts. Dairy Low-fat or nonfat cheeses, including ricotta and mozzarella. Skim or 1% milk (liquid, powdered, or evaporated). Buttermilk made with low-fat milk. Nonfat or low-fat yogurt. Fats and oils Non-hydrogenated (trans-free) margarines. Vegetable oils, including soybean, sesame, sunflower, olive, peanut, safflower, corn, canola, and cottonseed. Salad dressings or mayonnaise made with a vegetable oil. Beverages Water (mineral or sparkling). Coffee and tea. Diet carbonated beverages. Sweets and desserts Sherbet, gelatin, and fruit ice. Small amounts of dark chocolate. Limit all sweets and desserts. Seasonings and condiments All seasonings and condiments. The items listed  above may not be a complete list of foods and beverages you can eat. Contact a dietitian for more options. What foods are not recommended? Fruits Canned fruit in heavy syrup. Fruit in cream or butter sauce. Fried fruit. Limit coconut. Vegetables Vegetables cooked in cheese, cream, or butter sauce. Fried vegetables. Grains Breads made with saturated or trans fats, oils, or whole milk. Croissants. Sweet rolls. Donuts. High-fat crackers, such as cheese crackers. Meats and other proteins Fatty meats, such as hot dogs, ribs, sausage, bacon, rib-eye roast or steak. High-fat deli meats, such as salami and bologna. Caviar. Domestic duck and goose. Organ meats, such as liver. Dairy Cream, sour cream, cream cheese, and creamed cottage cheese. Whole milk cheeses. Whole or 2% milk (liquid, evaporated, or condensed). Whole buttermilk. Cream sauce or high-fat cheese sauce. Whole-milk yogurt. Fats and oils Meat fat, or shortening. Cocoa butter, hydrogenated oils, palm oil, coconut oil, palm kernel oil. Solid fats and shortenings, including bacon fat, salt pork, lard, and butter. Nondairy cream substitutes. Salad dressings with cheese or sour cream. Beverages Regular sodas and any drinks with added sugar. Sweets and desserts Frosting. Pudding. Cookies. Cakes. Pies. Milk chocolate or white chocolate. Buttered syrups. Full-fat ice cream or ice cream drinks. The items listed above may not be a complete list of foods and beverages to avoid. Contact a dietitian for more information. Summary  Heart-healthy meal planning includes limiting unhealthy fats,  increasing healthy fats, and making other diet and lifestyle changes.  Lose weight if you are overweight. Losing just 5-10% of your body weight can help your overall health and prevent diseases such as diabetes and heart disease.  Focus on eating a balance of foods, including fruits and vegetables, low-fat or nonfat dairy, lean protein, nuts and legumes, whole  grains, and heart-healthy oils and fats. This information is not intended to replace advice given to you by your health care provider. Make sure you discuss any questions you have with your health care provider. Document Revised: 06/26/2017 Document Reviewed: 06/26/2017 Elsevier Patient Education  2020 [employer].

## 2019-08-16 NOTE — Progress Notes (Signed)
08/16/2019 Alexander Taylor   1971-05-24  [id number]  Primary Physician Daisy Floro, MD Primary Cardiologist: Runell Gess MD [name], [place]  HPI:  Alexander Taylor is a 49 y.o. male, morbidly overweight with past medical history of asthma, gout, prediabetes, who presented to clinic for follow-up of shortness of breath.  He was referred by his primary care.  He has had 2 to 3 months of dyspnea on exertion with moderate to severe activity such as playing with his children in backyard.  He also had some atypical, dull chest pain that resolved.  He denies any worsening of shortness of breath.  Mentions that usual activities such as walking from parking lot to the building, does not make him short of breath.  He denies any cough, orthopnea, lower extremity edema.  He mentions that he has not been physically active particularly during pandemic and has gained 15 to 20 pounds since then.  He also has asthma and uses montelukast and rescue inhaler as needed.  He thinks that he is asthma has been under control.   Current Meds  Medication Sig  . allopurinol (ZYLOPRIM) 100 MG tablet Take 100 mg by mouth daily as needed.  . colchicine 0.6 MG tablet Two now and one in one hour.  Starting tomorrow one daily  . fluticasone (FLONASE) 50 MCG/ACT nasal spray Place 1 spray into both nostrils as needed for allergies or rhinitis.  . indomethacin (INDOCIN) 50 MG capsule Take 1 capsule (50 mg total) by mouth 2 (two) times daily with a meal. (Patient taking differently: Take 50 mg by mouth as needed. )  . montelukast (SINGULAIR) 10 MG tablet Take 10 mg by mouth daily.  . Multiple Vitamin (MULTIVITAMIN ADULT PO) Take 1 tablet by mouth daily.     Allergies  Allergen Reactions  . Penicillins Rash    Social History   Socioeconomic History  . Marital status: Married    Spouse name: Not on file  . Number of children: Not on file  . Years of education: Not on file  . Highest education level: Not on  file  Occupational History  . Not on file  Tobacco Use  . Smoking status: Never Smoker  . Smokeless tobacco: Never Used  Substance and Sexual Activity  . Alcohol use: Yes  . Drug use: No  . Sexual activity: Not on file  Other Topics Concern  . Not on file  Social History Narrative  . Not on file   Social Determinants of Health   Financial Resource Strain:   . Difficulty of Paying Living Expenses:   Food Insecurity:   . Worried About Programme researcher, broadcasting/film/video in the Last Year:   . Barista in the Last Year:   Transportation Needs:   . Freight forwarder (Medical):   Marland Kitchen Lack of Transportation (Non-Medical):   Physical Activity:   . Days of Exercise per Week:   . Minutes of Exercise per Session:   Stress:   . Feeling of Stress :   Social Connections:   . Frequency of Communication with Friends and Family:   . Frequency of Social Gatherings with Friends and Family:   . Attends Religious Services:   . Active Member of Clubs or Organizations:   . Attends Banker Meetings:   Marland Kitchen Marital Status:   Intimate Partner Violence:   . Fear of Current or Ex-Partner:   . Emotionally Abused:   Marland Kitchen Physically Abused:   .  Sexually Abused:      Review of Systems: General: negative for chills, fever, night sweats or weight changes.  Cardiovascular: negative for chest pain, dyspnea on exertion, edema, orthopnea, palpitations, paroxysmal nocturnal dyspnea or shortness of breath Dermatological: negative for rash Respiratory: negative for cough or wheezing Urologic: negative for hematuria Abdominal: negative for nausea, vomiting, diarrhea, bright red blood per rectum, melena, or hematemesis Neurologic: negative for visual changes, syncope, or dizziness All other systems reviewed and are otherwise negative except as noted above.    Blood pressure 116/86, pulse 100, height 6' (1.829 m), weight (!) 327 lb 9.6 oz (148.6 kg), SpO2 97 %.  General appearance: alert, cooperative  and no distress  Physical Exam  Constitutional: Well-developed and well-nourished. No acute distress.  HENT:  Head: Normocephalic and atraumatic.  Eyes: Conjunctivae are normal, EOM nl Cardiovascular:  RRR, nl S1S2, no murmur,  no LEE Respiratory: Effort normal and breath sounds normal. No respiratory distress. No wheezes.  GI: Soft, no distention, there is no tenderness.  Neurological: Is alert and oriented x 3  Skin: Not diaphoretic. No erythema, no rashes on extremities Psychiatric:  Normal mood and affect. Behavior is normal. Judgment and thought content normal.    EKG: Normal sinus rhythm, no significant ST-T changes, I personally reviewed the EKG.  ASSESSMENT AND PLAN:   Dyspnea on exertion Patient presented for follow up today.  2D echo was performed and was unremarkable except grade 1 diastolic dysfunction.  EF 60-65%.  No valvular disease. No worsening of dyspnea on exertion.  Is doing well with usual activity.  Given low risk factors, low calcium score, unremarkable echo, cardiovascular etiology ruled out.  His symptoms is almost controlled.  No further cardiology work-up at this point, recommending heart healthy diet, follow-up with his primary care and return in cardiology office as needed.    Atypical chest pain Resolved, and he has not had any further chest pain in the past month. He has low risk factors, calcium score was low (11), reassuring from cardiac standpoint. -No further work-up -Follow-up with primary care -Follow-up at cardiology office as needed   Linna Hoff, MD. IM PGY-2 08/16/2019 9:11 AM   Agree with note by Dr. Myrtie Hawk   Mr. Alexander Taylor returns for follow-up of his outpatient noninvasive test performed in the evaluation of dyspnea.  His 2D echo was essentially normal and his coronary calcium score was 11 suggesting that he had no significant CAD.  I suspect his dyspnea is related to weight gain and deconditioning.  Need for further cardiac evaluation  is warranted at this time.  I will see him back as needed.  Lorretta Harp, M.D., [place], [facility], [place] [address] [place], [place]  [address]  [phone] 08/16/2019 9:33 AM

## 2019-08-16 NOTE — Assessment & Plan Note (Addendum)
Resolved, and he has not had any further chest pain in the past month. He has low risk factors, calcium score was low (11), reassuring from cardiac standpoint. -No further cardiology work-up at this point -Heart healthy diet -Follow-up with primary care -Follow-up at cardiology office as needed

## 2019-08-16 NOTE — Assessment & Plan Note (Addendum)
Patient presented for follow up today.  2D echo was performed and was unremarkable except grade 1 diastolic dysfunction.  EF 60-65%.  No valvular disease. No worsening of dyspnea on exertion.  Is doing well with usual activity.  Given low risk factors, low calcium score, unremarkable echo, cardiovascular etiology ruled out.  His symptoms is almost controlled.  No further cardiology work-up at this point, recommending heart healthy diet, follow-up with his primary care and return in cardiology office as needed.

## 2019-10-03 DIAGNOSIS — M25371 Other instability, right ankle: Secondary | ICD-10-CM | POA: Diagnosis not present

## 2019-10-03 DIAGNOSIS — G8929 Other chronic pain: Secondary | ICD-10-CM | POA: Diagnosis not present

## 2019-10-03 DIAGNOSIS — S82831A Other fracture of upper and lower end of right fibula, initial encounter for closed fracture: Secondary | ICD-10-CM | POA: Diagnosis not present

## 2019-10-03 DIAGNOSIS — M25571 Pain in right ankle and joints of right foot: Secondary | ICD-10-CM | POA: Diagnosis not present

## 2019-10-03 DIAGNOSIS — X509XXA Other and unspecified overexertion or strenuous movements or postures, initial encounter: Secondary | ICD-10-CM | POA: Diagnosis not present

## 2019-10-03 DIAGNOSIS — S82891A Other fracture of right lower leg, initial encounter for closed fracture: Secondary | ICD-10-CM | POA: Diagnosis not present

## 2019-10-06 DIAGNOSIS — Z7409 Other reduced mobility: Secondary | ICD-10-CM | POA: Diagnosis not present

## 2019-10-06 DIAGNOSIS — M25373 Other instability, unspecified ankle: Secondary | ICD-10-CM | POA: Diagnosis not present

## 2019-10-06 DIAGNOSIS — Z789 Other specified health status: Secondary | ICD-10-CM | POA: Diagnosis not present

## 2019-10-06 DIAGNOSIS — M25571 Pain in right ankle and joints of right foot: Secondary | ICD-10-CM | POA: Diagnosis not present

## 2019-10-13 DIAGNOSIS — Z789 Other specified health status: Secondary | ICD-10-CM | POA: Diagnosis not present

## 2019-10-13 DIAGNOSIS — M25373 Other instability, unspecified ankle: Secondary | ICD-10-CM | POA: Diagnosis not present

## 2019-10-13 DIAGNOSIS — Z7409 Other reduced mobility: Secondary | ICD-10-CM | POA: Diagnosis not present

## 2019-10-13 DIAGNOSIS — M25571 Pain in right ankle and joints of right foot: Secondary | ICD-10-CM | POA: Diagnosis not present

## 2019-10-20 DIAGNOSIS — Z789 Other specified health status: Secondary | ICD-10-CM | POA: Diagnosis not present

## 2019-10-20 DIAGNOSIS — Z7409 Other reduced mobility: Secondary | ICD-10-CM | POA: Diagnosis not present

## 2019-10-20 DIAGNOSIS — M25571 Pain in right ankle and joints of right foot: Secondary | ICD-10-CM | POA: Diagnosis not present

## 2019-10-20 DIAGNOSIS — M25373 Other instability, unspecified ankle: Secondary | ICD-10-CM | POA: Diagnosis not present

## 2019-10-28 DIAGNOSIS — Z789 Other specified health status: Secondary | ICD-10-CM | POA: Diagnosis not present

## 2019-10-28 DIAGNOSIS — M25373 Other instability, unspecified ankle: Secondary | ICD-10-CM | POA: Diagnosis not present

## 2019-10-28 DIAGNOSIS — Z7409 Other reduced mobility: Secondary | ICD-10-CM | POA: Diagnosis not present

## 2019-10-28 DIAGNOSIS — M25571 Pain in right ankle and joints of right foot: Secondary | ICD-10-CM | POA: Diagnosis not present

## 2019-11-15 DIAGNOSIS — M25371 Other instability, right ankle: Secondary | ICD-10-CM | POA: Diagnosis not present

## 2019-11-15 DIAGNOSIS — S82831A Other fracture of upper and lower end of right fibula, initial encounter for closed fracture: Secondary | ICD-10-CM | POA: Diagnosis not present

## 2019-12-09 DIAGNOSIS — M25571 Pain in right ankle and joints of right foot: Secondary | ICD-10-CM | POA: Diagnosis not present

## 2019-12-09 DIAGNOSIS — Z789 Other specified health status: Secondary | ICD-10-CM | POA: Diagnosis not present

## 2019-12-09 DIAGNOSIS — Z7409 Other reduced mobility: Secondary | ICD-10-CM | POA: Diagnosis not present

## 2019-12-09 DIAGNOSIS — M25373 Other instability, unspecified ankle: Secondary | ICD-10-CM | POA: Diagnosis not present

## 2020-01-15 DIAGNOSIS — H60332 Swimmer's ear, left ear: Secondary | ICD-10-CM | POA: Diagnosis not present

## 2020-02-15 DIAGNOSIS — H524 Presbyopia: Secondary | ICD-10-CM | POA: Diagnosis not present

## 2020-03-27 DIAGNOSIS — M25462 Effusion, left knee: Secondary | ICD-10-CM | POA: Diagnosis not present

## 2020-03-27 DIAGNOSIS — S8992XA Unspecified injury of left lower leg, initial encounter: Secondary | ICD-10-CM | POA: Diagnosis not present

## 2020-03-27 DIAGNOSIS — W19XXXA Unspecified fall, initial encounter: Secondary | ICD-10-CM | POA: Diagnosis not present

## 2020-03-27 DIAGNOSIS — M25562 Pain in left knee: Secondary | ICD-10-CM | POA: Diagnosis not present

## 2020-03-29 DIAGNOSIS — M25462 Effusion, left knee: Secondary | ICD-10-CM | POA: Diagnosis not present

## 2020-03-29 DIAGNOSIS — S8392XA Sprain of unspecified site of left knee, initial encounter: Secondary | ICD-10-CM | POA: Diagnosis not present

## 2020-03-29 DIAGNOSIS — W19XXXA Unspecified fall, initial encounter: Secondary | ICD-10-CM | POA: Diagnosis not present

## 2020-04-16 DIAGNOSIS — R0981 Nasal congestion: Secondary | ICD-10-CM | POA: Diagnosis not present

## 2020-04-16 DIAGNOSIS — R5383 Other fatigue: Secondary | ICD-10-CM | POA: Diagnosis not present

## 2020-04-16 DIAGNOSIS — Z20822 Contact with and (suspected) exposure to covid-19: Secondary | ICD-10-CM | POA: Diagnosis not present

## 2020-04-16 DIAGNOSIS — R0982 Postnasal drip: Secondary | ICD-10-CM | POA: Diagnosis not present

## 2020-04-16 DIAGNOSIS — J029 Acute pharyngitis, unspecified: Secondary | ICD-10-CM | POA: Diagnosis not present

## 2020-04-16 DIAGNOSIS — R059 Cough, unspecified: Secondary | ICD-10-CM | POA: Diagnosis not present

## 2020-04-19 DIAGNOSIS — M2352 Chronic instability of knee, left knee: Secondary | ICD-10-CM | POA: Diagnosis not present

## 2020-04-19 DIAGNOSIS — M25562 Pain in left knee: Secondary | ICD-10-CM | POA: Diagnosis not present

## 2020-04-25 ENCOUNTER — Other Ambulatory Visit: Payer: Self-pay | Admitting: Orthopedic Surgery

## 2020-04-25 DIAGNOSIS — R609 Edema, unspecified: Secondary | ICD-10-CM

## 2020-04-25 DIAGNOSIS — R52 Pain, unspecified: Secondary | ICD-10-CM

## 2020-04-30 ENCOUNTER — Other Ambulatory Visit: Payer: Self-pay

## 2020-04-30 ENCOUNTER — Ambulatory Visit
Admission: RE | Admit: 2020-04-30 | Discharge: 2020-04-30 | Disposition: A | Discharge status: Home / Self Care | Payer: BLUE CROSS/BLUE SHIELD | Source: Ambulatory Visit | Attending: Orthopedic Surgery | Admitting: Orthopedic Surgery

## 2020-04-30 DIAGNOSIS — R609 Edema, unspecified: Secondary | ICD-10-CM

## 2020-04-30 DIAGNOSIS — R52 Pain, unspecified: Secondary | ICD-10-CM

## 2020-05-03 DIAGNOSIS — Z1159 Encounter for screening for other viral diseases: Secondary | ICD-10-CM | POA: Diagnosis not present

## 2020-06-10 ENCOUNTER — Ambulatory Visit
Admission: RE | Admit: 2020-06-10 | Discharge: 2020-06-10 | Disposition: A | Discharge status: Home / Self Care | Payer: BLUE CROSS/BLUE SHIELD | Source: Ambulatory Visit | Attending: Orthopedic Surgery | Admitting: Orthopedic Surgery

## 2020-06-10 ENCOUNTER — Other Ambulatory Visit: Payer: Self-pay

## 2020-06-10 DIAGNOSIS — R609 Edema, unspecified: Secondary | ICD-10-CM

## 2020-06-10 DIAGNOSIS — R52 Pain, unspecified: Secondary | ICD-10-CM

## 2021-05-06 IMAGING — CT CT HEART SCORING
3 series · 14 of 20 positions shown, 15 images · non-contrast
Comparison: None.
COMPARISON: None.

Addendum:
EXAM:
OVER-READ INTERPRETATION  CT CHEST

The following report is an over-read performed by radiologist Dr.
over-read does not include interpretation of cardiac or coronary
anatomy or pathology. The coronary calcium score interpretation by
the cardiologist is attached.
CLINICAL DATA: Risk stratification
Coronary Calcium Score
TECHNIQUE: The patient was scanned on a Siemens Force scanner. Axial
non-contrast 3 mm slices were carried out through the heart. The
data set was analyzed on a dedicated work station and scored using
the Agatson method.

[Series 2: casc 3.0 bv41 2 bestsyst 40 % · axial · 0.40mm/px · z∈[-250,-169]mm · 4 of 47 slices shown, 5 images]
[im 10/47  vessel]
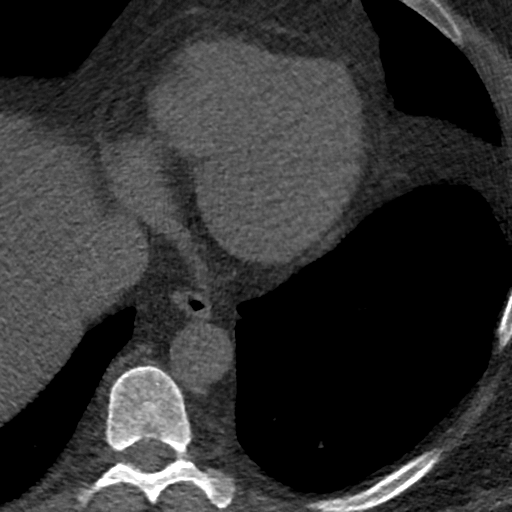
[im 10/47  lung]
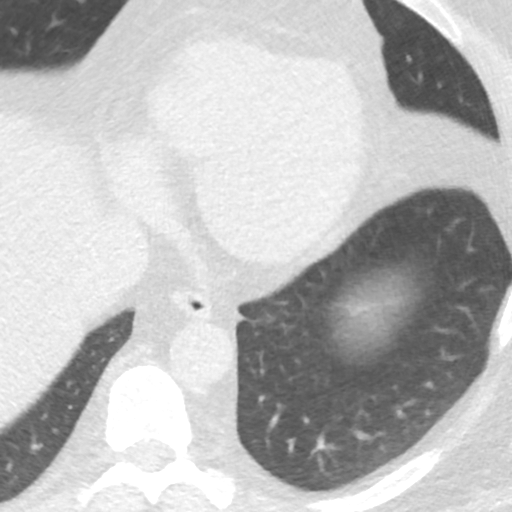
[im 19/47  vessel]
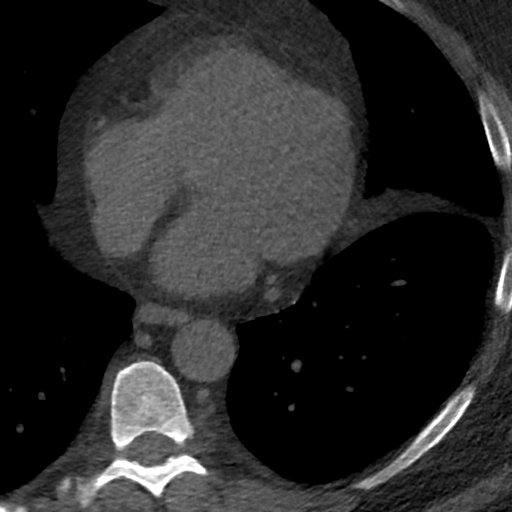
[im 28/47  vessel]
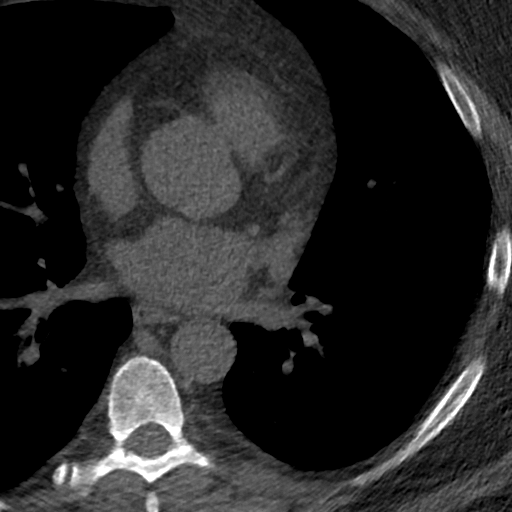
[im 37/47  vessel]
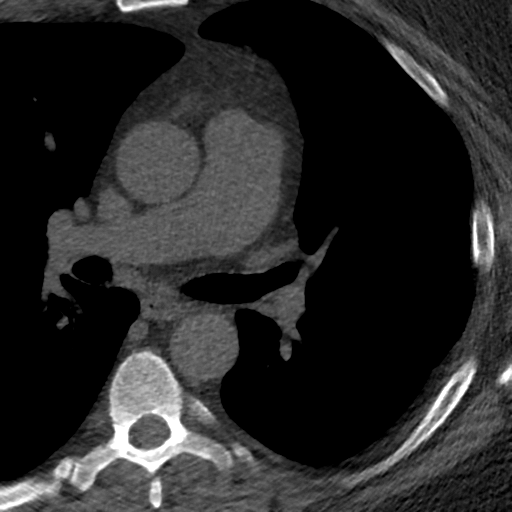

[Series 3: lung 40 % · axial · 0.78mm/px · z∈[-256,-162]mm · 5 of 47 slices shown]
[im 8/47  lung]
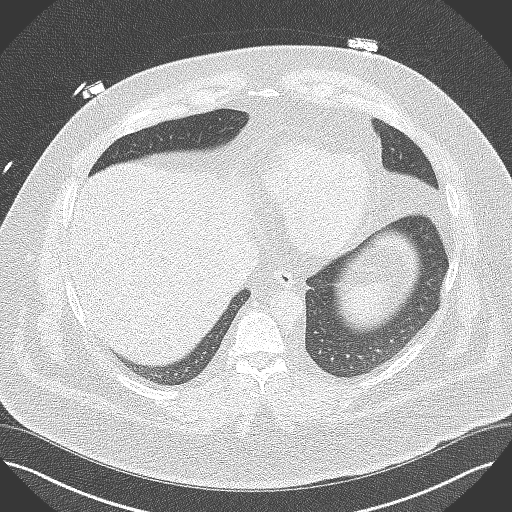
[im 16/47  lung]
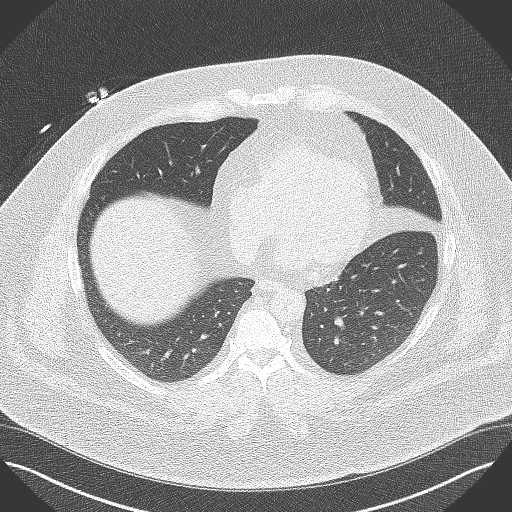
[im 24/47  lung]
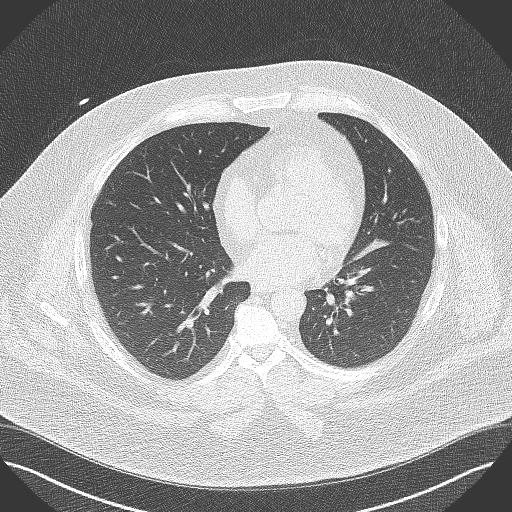
[im 31/47  lung]
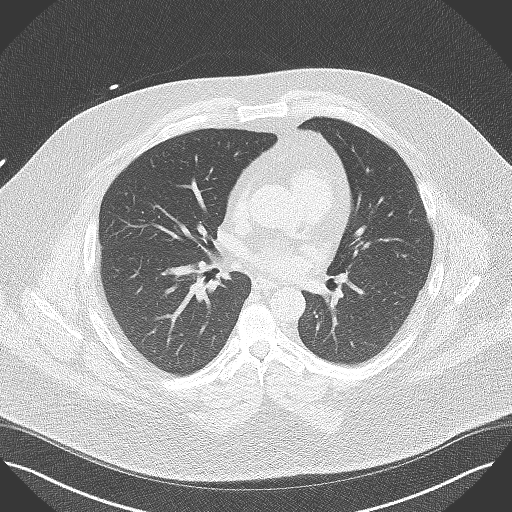
[im 39/47  lung]
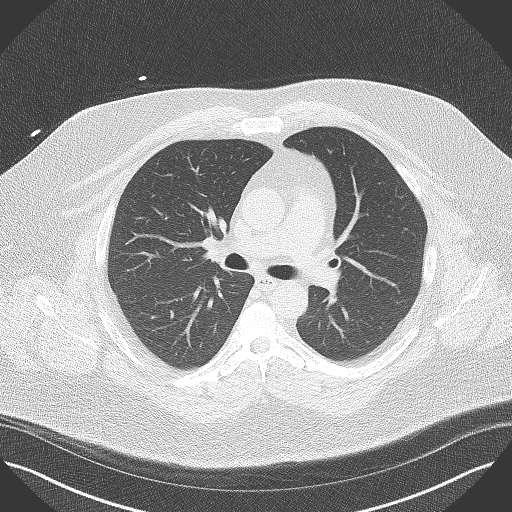

[Series 4: lung st 40 % · axial · 0.78mm/px · z∈[-256,-162]mm · 5 of 47 slices shown]
[im 8/47  lung]
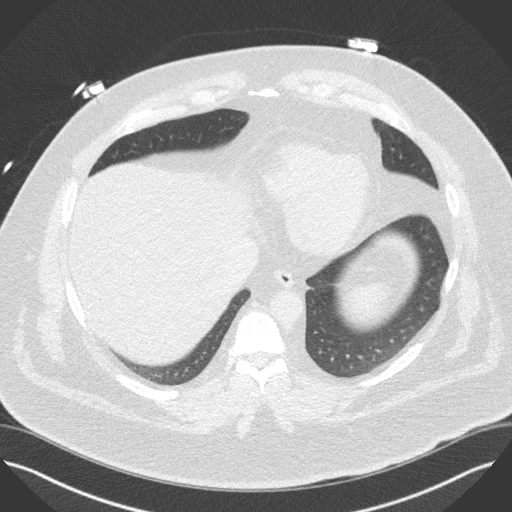
[im 16/47  lung]
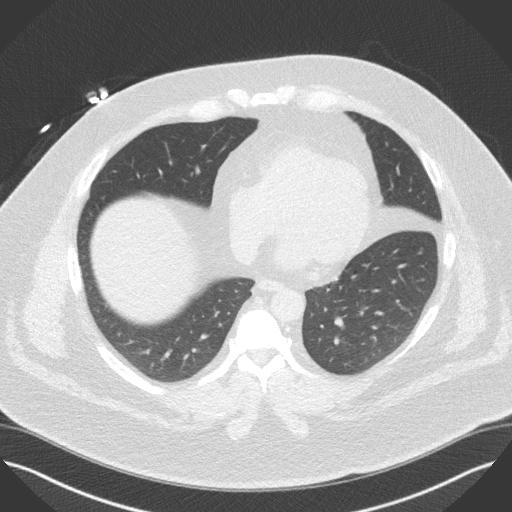
[im 24/47  lung]
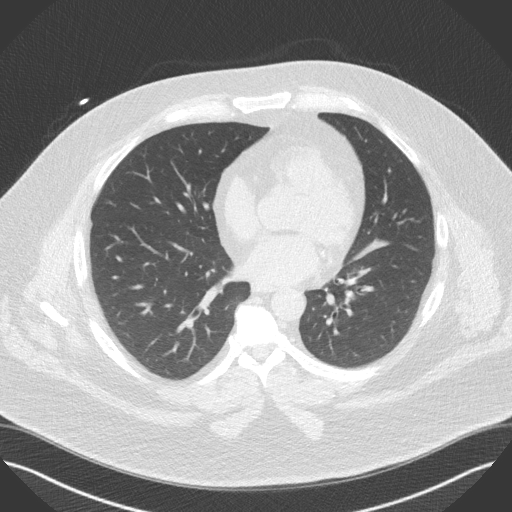
[im 31/47  lung]
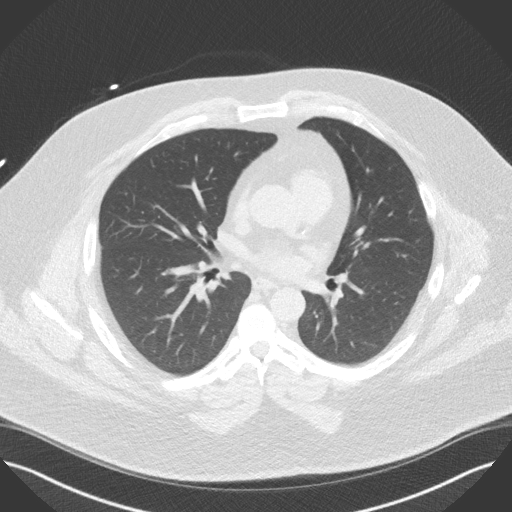
[im 39/47  lung]
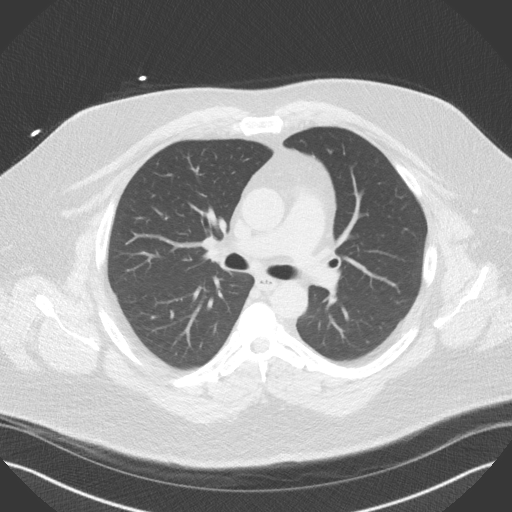

[14 of 20 positions shown; findings below may reference images not displayed]

FINDINGS: Vascular: No incidental findings.

Mediastinum/Nodes: The visualized mediastinum and hilar regions
demonstrate no lymphadenopathy or focal masses. There are some small
nonenlarged pretracheal and precarinal lymph nodes in the
mediastinum that appear benign.

Lungs/Pleura: Visualized lungs show no evidence of pulmonary edema,
consolidation, pneumothorax, nodule or pleural fluid.

Upper Abdomen: No acute abnormality.

Musculoskeletal: No chest wall mass or suspicious bone lesions
identified.
IMPRESSION: No significant incidental findings.
FINDINGS: Non-cardiac: See separate report from [REDACTED].

Ascending Aorta: 33 mm at the mid ascending aorta measured in an
axial plane.

Pericardium: Normal

Coronary arteries:

Coronary calcium score of 11. This was 78th percentile for age and
sex matched control.
IMPRESSION: Coronary calcium score of 11. This was 78th percentile for age and
sex matched control.

*** End of Addendum ***
EXAM:
OVER-READ INTERPRETATION  CT CHEST

The following report is an over-read performed by radiologist Dr.
over-read does not include interpretation of cardiac or coronary
anatomy or pathology. The coronary calcium score interpretation by
the cardiologist is attached.
FINDINGS: Vascular: No incidental findings.

Mediastinum/Nodes: The visualized mediastinum and hilar regions
demonstrate no lymphadenopathy or focal masses. There are some small
nonenlarged pretracheal and precarinal lymph nodes in the
mediastinum that appear benign.

Lungs/Pleura: Visualized lungs show no evidence of pulmonary edema,
consolidation, pneumothorax, nodule or pleural fluid.

Upper Abdomen: No acute abnormality.

Musculoskeletal: No chest wall mass or suspicious bone lesions
identified.
IMPRESSION: No significant incidental findings.
